# Patient Record
Sex: Male | Born: 1940 | Race: White | Hispanic: No | Smoking: Never smoker
Health system: Southern US, Community
[De-identification: ages and names within clinical notes are randomized; demographics above are authoritative.]

## PROBLEM LIST (undated history)

## (undated) DIAGNOSIS — I10 Essential (primary) hypertension: Secondary | ICD-10-CM

## (undated) DIAGNOSIS — E785 Hyperlipidemia, unspecified: Secondary | ICD-10-CM

## (undated) DIAGNOSIS — I509 Heart failure, unspecified: Secondary | ICD-10-CM

## (undated) DIAGNOSIS — D649 Anemia, unspecified: Secondary | ICD-10-CM

---

## 2014-02-02 HISTORY — PX: CORONARY ARTERY BYPASS GRAFT: SHX141

## 2014-02-11 ENCOUNTER — Emergency Department: Payer: No Typology Code available for payment source

## 2014-02-11 ENCOUNTER — Emergency Department
Admission: EM | Admit: 2014-02-11 | Discharge: 2014-02-11 | Disposition: A | Discharge status: Home / Self Care | Payer: No Typology Code available for payment source | Attending: Emergency Medicine | Admitting: Emergency Medicine

## 2014-02-11 ENCOUNTER — Emergency Department: Payer: PRIVATE HEALTH INSURANCE

## 2014-02-11 DIAGNOSIS — D649 Anemia, unspecified: Secondary | ICD-10-CM | POA: Insufficient documentation

## 2014-02-11 DIAGNOSIS — I509 Heart failure, unspecified: Secondary | ICD-10-CM | POA: Insufficient documentation

## 2014-02-11 DIAGNOSIS — Z951 Presence of aortocoronary bypass graft: Secondary | ICD-10-CM | POA: Insufficient documentation

## 2014-02-11 DIAGNOSIS — R0789 Other chest pain: Secondary | ICD-10-CM

## 2014-02-11 DIAGNOSIS — I10 Essential (primary) hypertension: Secondary | ICD-10-CM | POA: Insufficient documentation

## 2014-02-11 DIAGNOSIS — R071 Chest pain on breathing: Secondary | ICD-10-CM | POA: Insufficient documentation

## 2014-02-11 DIAGNOSIS — E785 Hyperlipidemia, unspecified: Secondary | ICD-10-CM | POA: Insufficient documentation

## 2014-02-11 HISTORY — DX: Heart failure, unspecified: I50.9

## 2014-02-11 HISTORY — DX: Anemia, unspecified: D64.9

## 2014-02-11 HISTORY — DX: Hyperlipidemia, unspecified: E78.5

## 2014-02-11 HISTORY — DX: Essential (primary) hypertension: I10

## 2014-02-11 LAB — CBC AND DIFFERENTIAL
Basophils Absolute Automated: 0.02 10*3/uL (ref 0.00–0.20)
Basophils Automated: 0 %
Eosinophils Absolute Automated: 0.24 10*3/uL (ref 0.00–0.70)
Eosinophils Automated: 3 %
Hematocrit: 26.1 % — ABNORMAL LOW (ref 42.0–52.0)
Hgb: 8.5 g/dL — ABNORMAL LOW (ref 13.0–17.0)
Lymphocytes Absolute Automated: 1.57 10*3/uL (ref 0.50–4.40)
Lymphocytes Automated: 18 %
MCH: 23.2 pg — ABNORMAL LOW (ref 28.0–32.0)
MCHC: 32.6 g/dL (ref 32.0–36.0)
MCV: 71.1 fL — ABNORMAL LOW (ref 80.0–100.0)
MPV: 9.6 fL (ref 9.4–12.3)
Monocytes Absolute Automated: 0.59 10*3/uL (ref 0.00–1.20)
Monocytes: 7 %
Neutrophils Absolute: 6.53 10*3/uL (ref 1.80–8.10)
Neutrophils: 73 %
Platelets: 405 10*3/uL — ABNORMAL HIGH (ref 140–400)
RBC: 3.67 10*6/uL — ABNORMAL LOW (ref 4.70–6.00)
RDW: 18 % — ABNORMAL HIGH (ref 12–15)
WBC: 8.95 10*3/uL (ref 3.50–10.80)

## 2014-02-11 LAB — COMPREHENSIVE METABOLIC PANEL
ALT: 29 U/L (ref 0–55)
AST (SGOT): 24 U/L (ref 5–34)
Albumin/Globulin Ratio: 1 (ref 0.9–2.2)
Albumin: 3.1 g/dL — ABNORMAL LOW (ref 3.5–5.0)
Alkaline Phosphatase: 72 U/L (ref 38–106)
Anion Gap: 12 (ref 5.0–15.0)
BUN: 22 mg/dL (ref 9.0–28.0)
Bilirubin, Total: 0.5 mg/dL (ref 0.2–1.2)
CO2: 22 mEq/L (ref 22–29)
Calcium: 8.8 mg/dL (ref 7.9–10.2)
Chloride: 107 mEq/L (ref 100–111)
Creatinine: 0.9 mg/dL (ref 0.7–1.3)
Globulin: 3.2 g/dL (ref 2.0–3.6)
Glucose: 114 mg/dL — ABNORMAL HIGH (ref 70–100)
Potassium: 4.4 mEq/L (ref 3.5–5.1)
Protein, Total: 6.3 g/dL (ref 6.0–8.3)
Sodium: 141 mEq/L (ref 136–145)

## 2014-02-11 LAB — TROPONIN I: Troponin I: 0.08 ng/mL (ref 0.00–0.09)

## 2014-02-11 LAB — GFR: EGFR: >60

## 2014-02-11 MED ORDER — OXYCODONE-ACETAMINOPHEN 5-325 MG PO TABS
1.0000 | ORAL_TABLET | Freq: Once | ORAL | Status: AC
Start: 2014-02-11 — End: 2014-02-11
  Administered 2014-02-11: 1 via ORAL
  Filled 2014-02-11: qty 1

## 2014-02-11 MED ORDER — OXYCODONE-ACETAMINOPHEN 5-325 MG PO TABS
ORAL_TABLET | ORAL | Status: DC
Start: 2014-02-11 — End: 2017-12-01

## 2014-02-11 NOTE — Discharge Instructions (Signed)
Musculoskeletal Chest Pain    You have been diagnosed with musculoskeletal chest pain.    Your pain is due to an injury or inflammation (swelling) of the muscles, ligaments, cartilage (soft bone), or bone in your chest. The pain is usually sharp and knife-like and becomes worse with twisting, bending, or moving. It commonly occurs in a small area, and can be irritated by pressing on it. There is usually no shortness of breath, lightheadedness, weakness, or sweaty feeling. Some children will have pain when taking a deep breath or when coughing. Exercise usually does not affect these symptoms.    Musculoskeletal chest pain is treated with anti-inflammatory medications like ibuprofen (Advil or Motrin) or naproxen (Aleve). Other pain medications are usually not needed. Depending on the reason for your symptoms, either warm or cool compresses (damp washcloths laid on the skin) may be helpful.    Most musculoskeletal chest pain improves over several days.    You do not need to follow up with a doctor unless your symptoms get worse or fail to improve in the next few days.    YOU SHOULD SEEK MEDICAL ATTENTION IMMEDIATELY, EITHER HERE OR AT THE NEAREST EMERGENCY DEPARTMENT, IF ANY OF THE FOLLOWING OCCURS:   Your pain gets worse.   Your pain makes you feel short of breath, nauseated, or sweaty.   You notice that your pain gets worse as you walk, go up stairs, or exert yourself.   You have any weakness or lightheadedness with your pain.   Your pain makes breathing difficult.   You develop a swollen leg.   Your symptoms get worse or you have other concerns.

## 2014-02-11 NOTE — ED Notes (Signed)
Status post CABG x 2 in [place] 1 week ago, [place] on 6/25; pt reports unrelieved chest pain at incision site. No new SOB.

## 2014-02-11 NOTE — ED Provider Notes (Signed)
EMERGENCY DEPARTMENT HISTORY AND PHYSICAL EXAM     Physician/Midlevel provider first contact with patient: 02/11/14 1733         Date: 02/11/2014  Patient Name: Matthew Hawkins    History of Presenting Illness     Chief Complaint   Patient presents with   . Chest Pain       History Provided By: Patient and family members     Chief Complaint: Chest Pain   Onset: June 18th 2015  Timing: constant   Location: at CABG incision site   Severity: moderate  Modifying Factors: worse w/ taking deep breaths and applying pressure to area; no relief w/ taking Tylenol    Associated Symptoms: none     Additional History: MICHAH MINTON is a 73 y.o. male with h/o HTN c/o constant chest pain s/p CABG operation on June 18th 2015. Pt had a CABG operate at [facility] because pt had a heart disease. Pt describes his CP as "dull" sensation and has the CP since the CABG operation. Pt's CP is exacerbated w/ taking deep breaths and applying pressure to area. Pt has been taking Tylenol for his CP with no relief. Family members sts that there was a miscommunication with getting pain medication Rx so pt only has been taking OTC Tylenol. Pt has an appointment with a local cardiologist, Dr. Suezanne Jacquet (phone#[phone]) this upcoming Thursday. Pt lives in [place] but pt is staying here because he has no family to care for his in [place]. Pt's brother lives here and is caring for him after the CABG procedure.       [facility] cardiac outreach operator phone #: [phone]    Pt denies any SOB, fever, chills, congestion, cough, nausea, vomiting,  or any other symptoms.     PCP: Pcp, Heriberto Antigua, MD (General)      No current facility-administered medications for this encounter.     Current Outpatient Prescriptions   Medication Sig Dispense Refill   . aspirin EC 81 MG EC tablet Take 81 mg by mouth daily.     . Cholecalciferol (VITAMIN D) 1000 UNIT tablet Take 1,000 Units by mouth daily.     Marland Kitchen docusate  sodium (COLACE) 100 MG capsule Take 100 mg by mouth 2 (two) times daily.     . ferrous sulfate 325 (65 FE) MG tablet Take 325 mg by mouth every morning with breakfast.     . folic acid (FOLVITE) 1 MG tablet Take 1 mg by mouth daily.     . furosemide (LASIX) 20 MG tablet Take 20 mg by mouth 2 (two) times daily.     Boris Lown Oil 1000 MG Cap Take by mouth.     . metoprolol (TOPROL-XL) 100 MG 24 hr tablet Take 100 mg by mouth daily.     . metoprolol XL (TOPROL-XL) 50 MG 24 hr tablet Take 50 mg by mouth daily.     . multivitamins-fortified A-D-K (SOURCECF) solution Take 0.5 mLs by mouth daily.     Marland Kitchen oxyCODONE-acetaminophen (PERCOCET) 5-325 MG per tablet 1-2 tablets by mouth every 4-6 hours as needed for pain;  Do not drive or operate machinery while taking this medicine 20 tablet 0       Past History     Past Medical History:  Past Medical History   Diagnosis Date   . Anemia    . Hypertension    . Congestive heart failure    . HLD (hyperlipidemia)  Past Surgical History:  Past Surgical History   Procedure Laterality Date   . Coronary artery bypass graft N/A 02/02/2014     Two vessel bypass.       Family History:  No family history on file.    Social History:  History   Substance Use Topics   . Smoking status: Never Smoker    . Smokeless tobacco: Never Used   . Alcohol Use: Yes       Allergies:  No Known Allergies    Review of Systems     Review of Systems   Constitutional: Negative for fever and chills.   HENT: Negative for congestion.    Eyes: Negative for discharge and redness.   Respiratory: Negative for cough and shortness of breath.    Cardiovascular: Positive for chest pain.   Gastrointestinal: Negative for nausea and vomiting.   Genitourinary: Negative for difficulty urinating.   Musculoskeletal: Negative for gait problem.   Skin: Negative for rash.   Allergic/Immunologic: Negative for environmental allergies and food allergies.          Physical Exam   BP 160/72 mmHg  Pulse 91  Temp(Src) 98.4 F (36.9 C)   Resp 18  Ht 1.6 m  Wt 74.39 kg  BMI 29.06 kg/m2  SpO2 99%    Physical Exam   Constitutional: He is oriented to person, place, and time and well-developed, well-nourished, and in no distress.   HENT:   Head: Normocephalic and atraumatic.   Eyes: Conjunctivae and EOM are normal.   Neck: Normal range of motion. Neck supple.   Cardiovascular: Normal rate, regular rhythm and normal heart sounds.    Pulmonary/Chest: Effort normal and breath sounds normal. He has no wheezes.   Well healing incision scar at the CABG site, no pus/discharge/erythema. Pt's chest pain is reproducible to light palpations.    Abdominal: Soft. There is no tenderness.   Musculoskeletal: Normal range of motion.   Neurological: He is alert and oriented to person, place, and time.   Skin: Skin is warm and dry.   Psychiatric: Affect and judgment normal.   Nursing note and vitals reviewed.        Diagnostic Study Results     Labs -     Results    Procedure Component Value Units Date/Time    Troponin I [[id number]] Collected:  02/11/14 1809    Specimen Information:  Blood Updated:  02/11/14 1843     Troponin I 0.08 ng/mL     Comprehensive metabolic panel [[id number]]  (Abnormal) Collected:  02/11/14 1809    Specimen Information:  Blood Updated:  02/11/14 1836     Glucose 114 (H) mg/dL      BUN 78.2 mg/dL      Creatinine 0.9 mg/dL      Sodium 956 mEq/L      Potassium 4.4 mEq/L      Chloride 107 mEq/L      CO2 22 mEq/L      CALCIUM 8.8 mg/dL      Protein, Total 6.3 g/dL      Albumin 3.1 (L) g/dL      AST (SGOT) 24 U/L      ALT 29 U/L      Alkaline Phosphatase 72 U/L      Bilirubin, Total 0.5 mg/dL      Globulin 3.2 g/dL      Albumin/Globulin Ratio 1.0      Anion Gap 12.0     GFR [[id number]] Collected:  02/11/14  1809     EGFR >60.0 Updated:  02/11/14 1836    CBC and differential [[id number]]  (Abnormal) Collected:  02/11/14 1809    Specimen Information:  Blood / Blood Updated:  02/11/14 1815     WBC 8.95 x10 3/uL      RBC 3.67 (L) x10 6/uL      Hgb 8.5  (L) g/dL      Hematocrit 47.8 (L) %      MCV 71.1 (L) fL      MCH 23.2 (L) pg      MCHC 32.6 g/dL      RDW 18 (H) %      Platelets 405 (H) x10 3/uL      MPV 9.6 fL      Neutrophils 73 %      Lymphocytes Automated 18 %      Monocytes 7 %      Eosinophils Automated 3 %      Basophils Automated 0 %      Immature Granulocyte Unmeasured %      Neutrophils Absolute 6.53 x10 3/uL      Abs Lymph Automated 1.57 x10 3/uL      Abs Mono Automated 0.59 x10 3/uL      Abs Eos Automated 0.24 x10 3/uL      Absolute Baso Automated 0.02 x10 3/uL      Absolute Immature Granulocyte Unmeasured x10 3/uL           Radiologic Studies -   Radiology Results (24 Hour)    Procedure Component Value Units Date/Time    XR Chest  AP Portable [[id number]] Collected:  02/11/14 1804    Order Status:  Completed Updated:  02/11/14 1808    Narrative:      HISTORY:  Chest pain.    TECHNIQUE: Single frontal view of the chest was obtained.     PRIORS: None.    FINDINGS: The lung fields are clear. There are no pleural effusions. The  cardiac silhouette and hila are normal. The trachea is midline. The bony  structures are essentially unremarkable.       Impression:       No active lung disease.    Georgana Curio, MD   02/11/2014 6:04 PM        .      Medical Decision Making   I am the first provider for this patient.    I reviewed the vital signs, available nursing notes, past medical history, past surgical history, family history and social history.    Vital Signs-Reviewed the patient's vital signs.     Patient Vitals for the past 12 hrs:   BP Temp Pulse Resp   02/11/14 1739 160/72 mmHg 98.4 F (36.9 C) 91 18   02/11/14 1738 160/72 mmHg 97.5 F (36.4 C) 80 20       Pulse Oximetry Analysis - Normal 99% on RA    Cardiac Monitor:  Rate: 74  Rhythm:  Normal Sinus Rhythm     EKG:  Interpreted by the EP.   Time Interpreted: 17:30    Rate: 79   Rhythm: Normal Sinus Rhythm    Interpretation: NST, no STEMI   Comparison: No prior study is available for  comparison.    Old Medical Records: Nursing notes.     ED Course:   6:34 PM - Updated pt and family members on pt's CXR report. Pt is feeling better, but still in pain. Awaiting additional lab results.  6:55 PM - Paging pt's local cardiologist, Dr. Suezanne Jacquet.     6:59 PM - d/w Dr. Kandra Nicolas, pt's local cardiologist who agrees w/ plan. He will see pt in the office as previously scheduled.     7:00 PM - Patient feels better and wants to go home. Patient is aware of all results. He is amenable for discharge w/ family members at bedside. Discussed d/c instructions including outpatient f/u with Dr. Kandra Nicolas as previously scheduled. Advised pt to take the prescribed Percocet for pain control.  Discussed return precautions. Pt verbalizes understanding and agrees with plan. All questions and concerns were addressed.     Provider Notes: Pt with reproducible chest wall pain that pt states has been constant since surgery with no pain meds other than OTC tylenol due to script issues.  Pt felt improved in ED with narcotic pain meds with neg workup.  D/w cardiology scheduled to see him this week and agreed to plan.      Diagnosis     Clinical Impression:   1. Chest wall pain    2. S/P CABG x 2        _______________________________    Attestations:  This note is prepared by Tammi Klippel, acting as scribe for Humberto Leep, MD    Humberto Leep, MD, The scribe's documentation has been prepared under my direction and personally reviewed by me in its entirety.  I confirm that the note above accurately reflects all work, treatment, procedures, and medical decision making performed by me.    _______________________________                            Humberto Leep, MD  02/11/14 2020

## 2014-02-12 LAB — ECG 12-LEAD
Atrial Rate: 79 {beats}/min
P Axis: 19 degrees
P-R Interval: 176 ms
Q-T Interval: 422 ms
QRS Duration: 88 ms
QTC Calculation (Bezet): 483 ms
R Axis: 24 degrees
T Axis: 99 degrees
Ventricular Rate: 79 {beats}/min

## 2014-03-05 ENCOUNTER — Inpatient Hospital Stay
Admission: RE | Admit: 2014-03-05 | Discharge: 2014-03-05 | Disposition: A | Discharge status: Home / Self Care | Payer: PRIVATE HEALTH INSURANCE | Source: Ambulatory Visit | Attending: Cardiovascular Disease | Admitting: Cardiovascular Disease

## 2014-03-05 VITALS — Ht 62.99 in

## 2014-03-05 DIAGNOSIS — Z951 Presence of aortocoronary bypass graft: Secondary | ICD-10-CM | POA: Insufficient documentation

## 2014-03-05 DIAGNOSIS — Z954 Presence of other heart-valve replacement: Secondary | ICD-10-CM | POA: Insufficient documentation

## 2014-03-05 NOTE — Progress Notes (Signed)
Physical Assessment    BP  R:  140/76  L:  134/74    Pulse:  regular, 59  O2 sat: 98%    Pulses  Left Side Right Side   PT:  + Doppler  PT:  + Doppler   Radial:  1+   Radial:  1+   Carotid:  1+ Carotid:  1+       Heart Sounds:  S1, S2 and systolic murmur     Lung Sounds:  clear right upper, middle and lower and clear left upper, middle and lower     Muscle Skeletal:  no deficit    Incisions:  Sternum: Steri Strips remain in place with encrustation underneath (> 1 month post-op). No visible active drainage. No redness/induration. Instructed re: should shower/wash area to encourage strips to fall off.           Left leg vein harvest site: Steri Strip in place, no drainage/redness/induration.    Peripheral Edema:  the right leg trace and the left leg trace; brisk capillary refill    Diabetic Foot Check:  n/a

## 2014-03-05 NOTE — Progress Notes (Signed)
   Patient came in for initial assessment for Cardiac Rehab.     Had CABG x2 and AVR done on 01/31/14.  Patient stated he had been expecting to have an AVR at some point but when he noted chest pain while driving he called his cardiologist who urged him to go to the ED then admitted, where he had both procedures.     Originally from [place], but staying with brother in [place] until he is cleared by Careers adviser in [place] to resume living independently.  At that point he will return to [place] to complete rehab.     Reports sternal pain (4/10) that is relieved by pain medications.     Prostate cancer in 2013

## 2014-03-07 ENCOUNTER — Inpatient Hospital Stay
Admission: RE | Admit: 2014-03-07 | Discharge: 2014-03-07 | Disposition: A | Discharge status: Home / Self Care | Payer: PRIVATE HEALTH INSURANCE | Source: Ambulatory Visit

## 2014-03-07 VITALS — BP 116/66 | Wt 169.8 lb

## 2014-03-07 DIAGNOSIS — Z954 Presence of other heart-valve replacement: Secondary | ICD-10-CM

## 2014-03-07 DIAGNOSIS — Z951 Presence of aortocoronary bypass graft: Secondary | ICD-10-CM

## 2014-03-07 NOTE — Progress Notes (Signed)
Pt oriented to phase II cardiac rehab per protocol.  Medication list reviewed.  Pt reports intermittent chest wall discomfort.  Rates pain 4/10 pre-exercise today.  Reviewed the "Readiness for Exercise Checklist", "Boarding Pass", and RPE scale.  Introduced radial pulse check; accurate count at rest.  Pt oriented to exercise equipment.  Exercised without decompensation.  No c/o chest wall discomfort.   Exercise prescription to be advanced as tolerated.

## 2014-03-11 ENCOUNTER — Inpatient Hospital Stay
Admission: RE | Admit: 2014-03-11 | Discharge: 2014-03-11 | Disposition: A | Discharge status: Home / Self Care | Payer: PRIVATE HEALTH INSURANCE | Source: Ambulatory Visit

## 2014-03-11 VITALS — Wt 169.5 lb

## 2014-03-11 DIAGNOSIS — Z954 Presence of other heart-valve replacement: Secondary | ICD-10-CM

## 2014-03-11 DIAGNOSIS — Z951 Presence of aortocoronary bypass graft: Secondary | ICD-10-CM

## 2014-03-11 NOTE — Progress Notes (Signed)
Pt presented for day 2 phase II cardiac rehab.  Pt reports having a stress today "passed".  Exercise deferred.  Pt to return to exercise on 03/13/14.

## 2014-03-13 ENCOUNTER — Inpatient Hospital Stay
Admission: RE | Admit: 2014-03-13 | Discharge: 2014-03-13 | Disposition: A | Discharge status: Home / Self Care | Payer: PRIVATE HEALTH INSURANCE | Source: Ambulatory Visit

## 2014-03-13 ENCOUNTER — Ambulatory Visit: Payer: PRIVATE HEALTH INSURANCE

## 2014-03-13 VITALS — BP 130/58 | Wt 169.7 lb

## 2014-03-13 DIAGNOSIS — Z954 Presence of other heart-valve replacement: Secondary | ICD-10-CM

## 2014-03-13 DIAGNOSIS — Z951 Presence of aortocoronary bypass graft: Secondary | ICD-10-CM

## 2014-03-14 ENCOUNTER — Ambulatory Visit: Payer: PRIVATE HEALTH INSURANCE

## 2014-03-14 ENCOUNTER — Inpatient Hospital Stay
Admission: RE | Admit: 2014-03-14 | Discharge: 2014-03-14 | Disposition: A | Discharge status: Home / Self Care | Payer: PRIVATE HEALTH INSURANCE | Source: Ambulatory Visit

## 2014-03-14 VITALS — BP 98/60 | Wt 169.9 lb

## 2014-03-14 DIAGNOSIS — Z954 Presence of other heart-valve replacement: Secondary | ICD-10-CM

## 2014-03-14 DIAGNOSIS — Z951 Presence of aortocoronary bypass graft: Secondary | ICD-10-CM

## 2014-03-14 NOTE — Addendum Note (Signed)
Encounter addended by: Thurnell Lose, RN on: 03/14/2014  6:07 PM<BR>     Documentation filed: Inpatient Document Flowsheet

## 2014-03-14 NOTE — Addendum Note (Signed)
Encounter addended by: Thurnell Lose, RN on: 03/14/2014  6:10 PM<BR>     Documentation filed: Inpatient Document Flowsheet

## 2014-03-18 ENCOUNTER — Inpatient Hospital Stay
Admission: RE | Admit: 2014-03-18 | Discharge: 2014-03-18 | Disposition: A | Discharge status: Home / Self Care | Payer: PRIVATE HEALTH INSURANCE | Source: Ambulatory Visit | Attending: Cardiovascular Disease | Admitting: Cardiovascular Disease

## 2014-03-18 ENCOUNTER — Ambulatory Visit: Payer: PRIVATE HEALTH INSURANCE

## 2014-03-18 VITALS — BP 104/58 | Wt 167.2 lb

## 2014-03-18 DIAGNOSIS — Z954 Presence of other heart-valve replacement: Secondary | ICD-10-CM

## 2014-03-18 DIAGNOSIS — Z951 Presence of aortocoronary bypass graft: Secondary | ICD-10-CM

## 2014-03-20 ENCOUNTER — Ambulatory Visit: Payer: PRIVATE HEALTH INSURANCE

## 2014-03-20 ENCOUNTER — Inpatient Hospital Stay
Admission: RE | Admit: 2014-03-20 | Discharge: 2014-03-20 | Disposition: A | Discharge status: Home / Self Care | Payer: PRIVATE HEALTH INSURANCE | Source: Ambulatory Visit

## 2014-03-20 VITALS — BP 104/52 | Wt 168.4 lb

## 2014-03-20 DIAGNOSIS — Z951 Presence of aortocoronary bypass graft: Secondary | ICD-10-CM

## 2014-03-20 DIAGNOSIS — Z954 Presence of other heart-valve replacement: Secondary | ICD-10-CM

## 2014-03-20 NOTE — Addendum Note (Signed)
Encounter addended by: Thurnell Lose, RN on: 03/20/2014  4:13 PM<BR>     Documentation filed: Inpatient Document Flowsheet

## 2014-03-21 ENCOUNTER — Ambulatory Visit: Payer: PRIVATE HEALTH INSURANCE

## 2014-03-21 ENCOUNTER — Inpatient Hospital Stay
Admission: RE | Admit: 2014-03-21 | Discharge: 2014-03-21 | Disposition: A | Discharge status: Home / Self Care | Payer: PRIVATE HEALTH INSURANCE | Source: Ambulatory Visit

## 2014-03-21 VITALS — BP 112/64 | Wt 167.4 lb

## 2014-03-21 DIAGNOSIS — Z951 Presence of aortocoronary bypass graft: Secondary | ICD-10-CM

## 2014-03-21 DIAGNOSIS — Z954 Presence of other heart-valve replacement: Secondary | ICD-10-CM

## 2014-03-25 ENCOUNTER — Ambulatory Visit: Payer: PRIVATE HEALTH INSURANCE

## 2014-03-25 ENCOUNTER — Inpatient Hospital Stay
Admission: RE | Admit: 2014-03-25 | Discharge: 2014-03-25 | Disposition: A | Discharge status: Home / Self Care | Payer: PRIVATE HEALTH INSURANCE | Source: Ambulatory Visit

## 2014-03-25 VITALS — BP 120/76 | Wt 169.9 lb

## 2014-03-25 DIAGNOSIS — Z951 Presence of aortocoronary bypass graft: Secondary | ICD-10-CM

## 2014-03-25 DIAGNOSIS — Z954 Presence of other heart-valve replacement: Secondary | ICD-10-CM

## 2014-03-25 NOTE — Progress Notes (Signed)
Completes 7 sessions phase 2 CR.  Patient is moving to [facility] at this time and will be discontinuing the program.  Remaining handouts provided to patient on exercise, diet, weather guidelines, stress and hypertension.  Advised patient go to medical records to sign a release so he can have his records transferred.  Has exercised without signs or symptoms and a normal hemodynamic response to exercise noted.  Cardiac monitor showing NSR with occasional PVC's.  Patient will be discharged from the program.

## 2014-03-26 NOTE — Progress Notes (Signed)
I have reviewed [facility] Cardiac Rehab data and recommendations.  I agree with plan.    Breklyn Fabrizio MD FACC  Medical Director, [facility] Cardiac Rehabilitation

## 2014-03-26 NOTE — Progress Notes (Signed)
I have reviewed [facility] Cardiac Rehab data and recommendations.  I agree with plan.    Orien Mayhall MD FACC  Medical Director, [facility] Cardiac Rehabilitation

## 2014-03-26 NOTE — Progress Notes (Signed)
I have reviewed [facility] Cardiac Rehab data and recommendations.  I agree with plan.    Takoda Janowiak MD FACC  Medical Director, [facility] Cardiac Rehabilitation

## 2014-03-27 ENCOUNTER — Ambulatory Visit: Payer: PRIVATE HEALTH INSURANCE

## 2014-03-28 ENCOUNTER — Ambulatory Visit: Payer: PRIVATE HEALTH INSURANCE

## 2014-04-01 ENCOUNTER — Ambulatory Visit: Payer: PRIVATE HEALTH INSURANCE

## 2014-04-03 ENCOUNTER — Ambulatory Visit: Payer: PRIVATE HEALTH INSURANCE

## 2014-04-04 ENCOUNTER — Ambulatory Visit: Payer: PRIVATE HEALTH INSURANCE

## 2014-04-08 ENCOUNTER — Ambulatory Visit: Payer: PRIVATE HEALTH INSURANCE

## 2014-04-10 ENCOUNTER — Ambulatory Visit: Payer: PRIVATE HEALTH INSURANCE

## 2014-04-11 ENCOUNTER — Ambulatory Visit: Payer: PRIVATE HEALTH INSURANCE

## 2014-04-15 ENCOUNTER — Ambulatory Visit: Payer: PRIVATE HEALTH INSURANCE

## 2014-04-29 NOTE — Addendum Note (Signed)
Encounter addended by: Thurnell Lose, RN on: 04/29/2014  8:00 AM<BR>     Documentation filed: Inpatient Patient Education

## 2020-09-15 IMAGING — MR MRI BRAIN WITH  IAC W/WO CONTRAST
12 series · 48 of 48 positions shown · IV contrast (gadavist)
Comparison: None

MRI BRAIN WITH  IAC W/WO CONTRAST,09/15/2020 [DATE]: 
CLINICAL INDICATION: Bilateral hearing loss, history prostate cancer
TECHNIQUE: Axial T1, Axial T2, Axial FLAIR, Diffusion weighted images, Sagittal 
T1, Enhanced Axial T1, and Enhanced coronal fat-suppressed T1 were obtained. 
Thin section axial T2 images through the IACs with sagittal and coronal 
reconstruction, thin section enhanced axial T1 images through the IACs with 
coronal reconstruction, enhanced coronal 2 mm images and enhanced axial images 
of the entire brain. FLAIR images of the brain. 8 cc gadavist is administered 
intravenously. The patient's eGFR was calculated to be 86 using the i-STAT 
device.

[Series 103: patient aligned mpr · axial · 15.6mm · 0.98mm/px · z∈[-76,+140]mm · 4 of 47 slices shown]
[im 1/47]
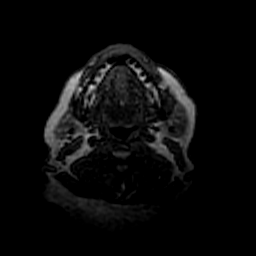
[im 16/47]
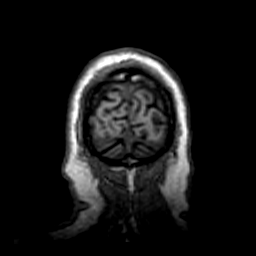
[im 31/47]
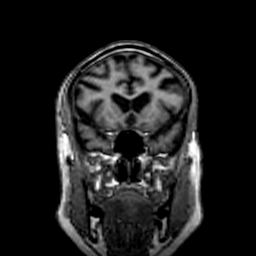
[im 47/47]
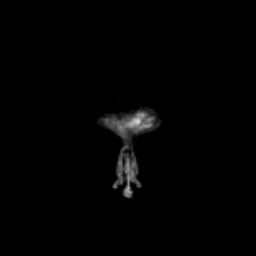

[Series 401: T2 · axial · 5.0mm · 0.41mm/px · z∈[-53,+102]mm · 2 of 27 slices shown]
[im 1/27]
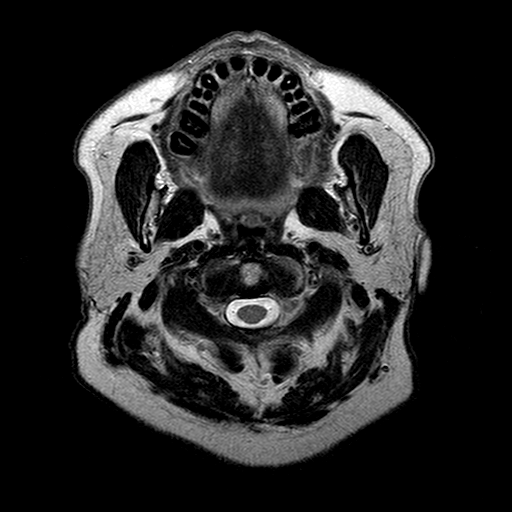
[im 27/27]
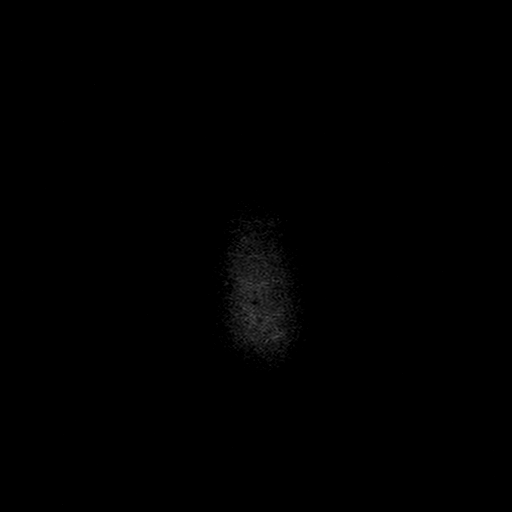

[Series 501: FLAIR · axial · 5.0mm · 0.73mm/px · z∈[-53,+102]mm · 3 of 27 slices shown]
[im 1/27]
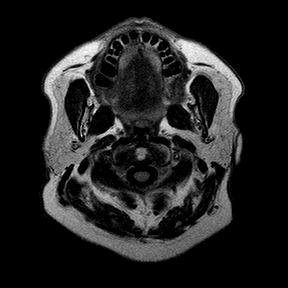
[im 14/27]
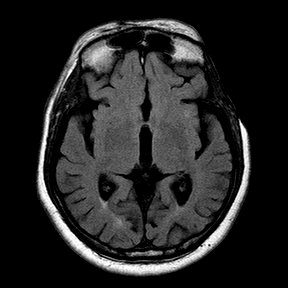
[im 27/27]
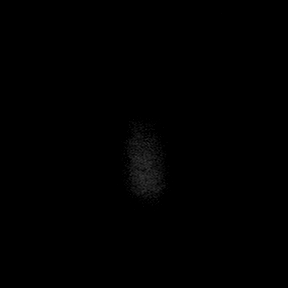

[Series 603: (id) · axial · 5.0mm · 1.80mm/px · z∈[-51,+103]mm · 3 of 27 slices shown]
[im 1/27]
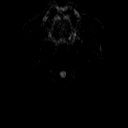
[im 14/27]
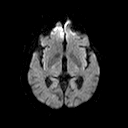
[im 27/27]
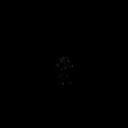

[Series 604: dadc map · axial · 5.0mm · 1.80mm/px · z∈[-51,+103]mm · 3 of 27 slices shown]
[im 1/27]
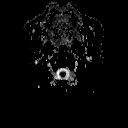
[im 14/27]
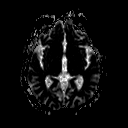
[im 27/27]
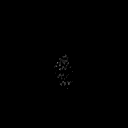

[Series 701: SWI · axial · 5.0mm · 0.85mm/px · z∈[-50,+104]mm · 3 of 27 slices shown]
[im 1/27]
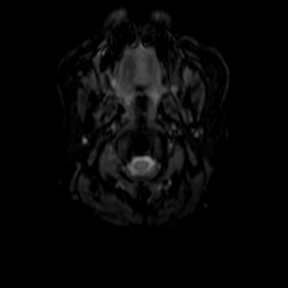
[im 14/27]
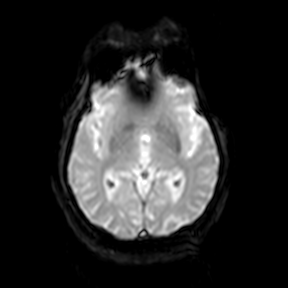
[im 27/27]
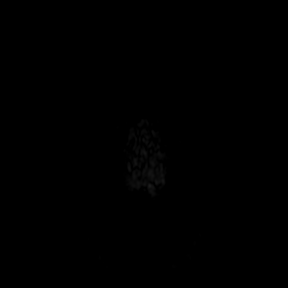

[Series 801: t2w_3d_drive · axial · 1.4mm · 0.51mm/px · z∈[-39,-5]mm · 5 of 50 slices shown]
[im 1/50]
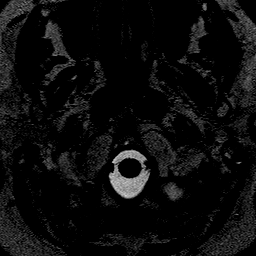
[im 13/50]
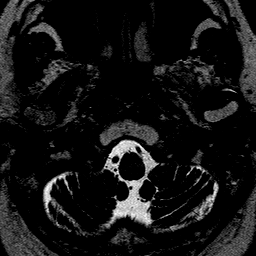
[im 25/50]
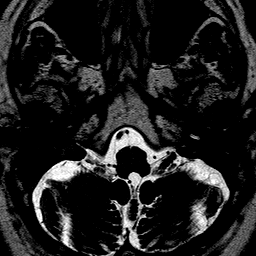
[im 37/50]
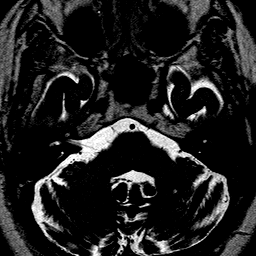
[im 50/50]
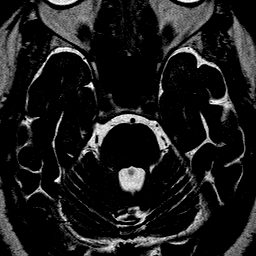

[Series 803: mpr/sag · sagittal · 0.5mm · 0.51mm/px · 8 of 83 slices shown]
[im 1/83]
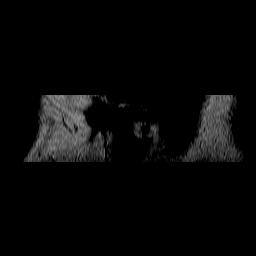
[im 12/83]
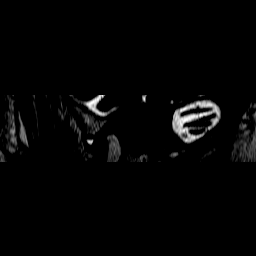
[im 24/83]
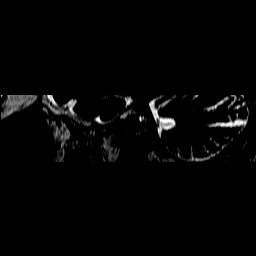
[im 36/83]
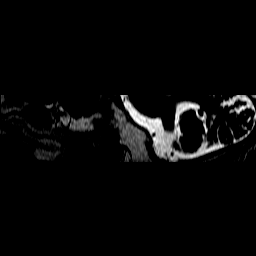
[im 47/83]
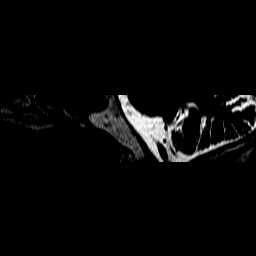
[im 59/83]
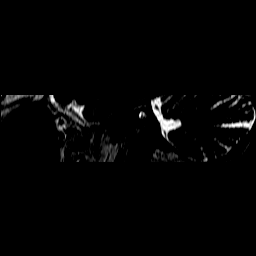
[im 71/83]
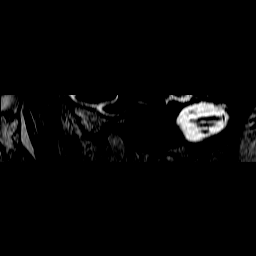
[im 83/83]
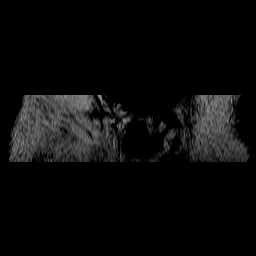

[Series 805: mpr/cor · coronal · 0.5mm · 0.51mm/px · 8 of 78 slices shown]
[im 1/78]
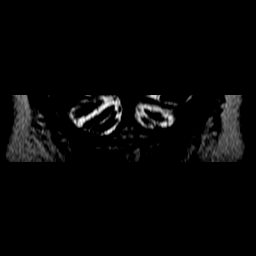
[im 12/78]
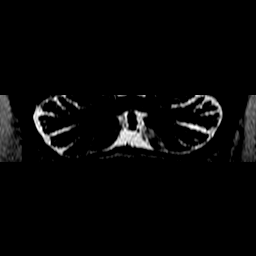
[im 23/78]
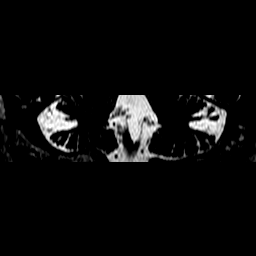
[im 34/78]
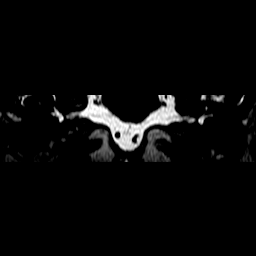
[im 45/78]
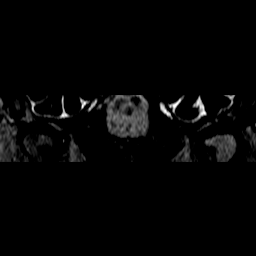
[im 56/78]
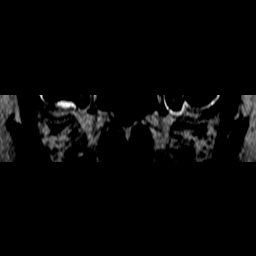
[im 67/78]
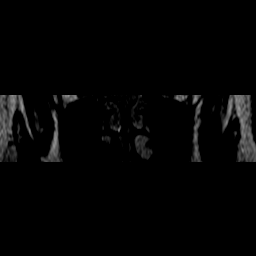
[im 78/78]
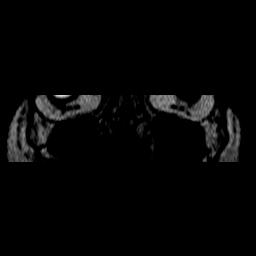

[Series 901: T1 post-contrast · axial · 5.0mm · 0.60mm/px · z∈[-53,+102]mm · 3 of 27 slices shown (1 of 3)]
[im 1/27]
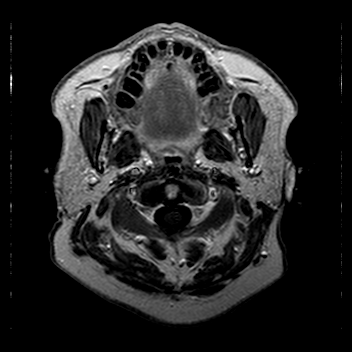
[im 14/27]
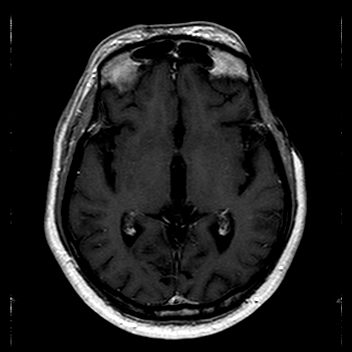
[im 27/27]
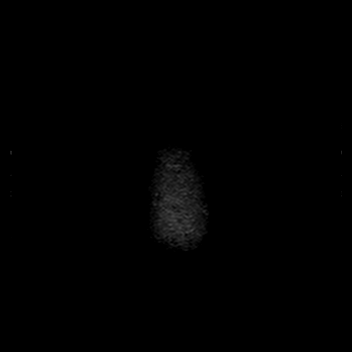

[Series 1101: T1 post-contrast · coronal · 4.0mm · 0.88mm/px · 4 of 36 slices shown (2 of 3)]
[im 1/36]
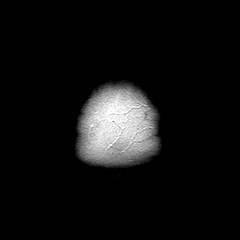
[im 12/36]
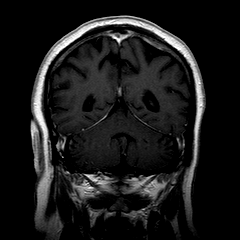
[im 24/36]
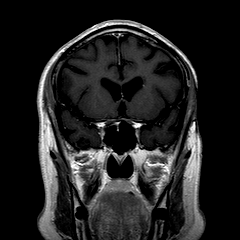
[im 36/36]
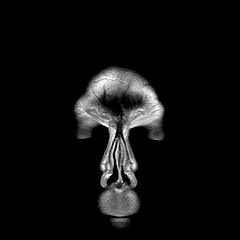

[Series 1201: T1 post-contrast · sagittal · 4.0mm · 0.86mm/px · 2 of 25 slices shown (3 of 3)]
[im 1/25]
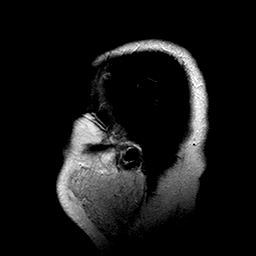
[im 25/25]
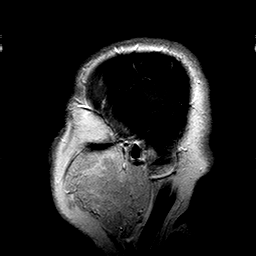

[48 of 48 positions shown; findings below may reference images not displayed]

FINDINGS: There is no evidence for vestibular schwannoma. Cranial nerves VII and 
VIII are unremarkable. No findings to indicate otic dysplasia. There is mild to 
moderate cerebral cortical atrophy. There is a small chronic left cerebellar 
infarct. There are mild to moderate chronic microangiopathic changes. Diffusion 
images are unremarkable. There is no hydrocephalus or extra-axial collection. No 
pathologic parenchymal or meningeal enhancement identified. There is no evidence 
for intracranial mass. No focal brainstem lesion identified. Major intracranial 
arterial segments are open. Sellar contents are normal. Craniocervical junction 
is open. Paranasal sinuses and otomastoid spaces are predominantly mild fluid in 
left mastoid tip is most likely chronic. There is no evidence for calvarial, 
skull base or orbital mass.
IMPRESSION: No evidence for vestibular schwannoma or other intracranial mass. 
Mild to moderate atrophy and chronic-appearing white matter microangiopathy. 
Small chronic left cerebellar infarct. 
Major intracranial arterial segments are open. 
Cervical spondylosis at C4-5. There appears to be mild cord effacement. This is 
not completely evaluated; cervical MRI would be useful if indicated. .

## 2022-03-30 IMAGING — MR MRI BRAIN WITH  IAC W/WO CONTRAST
2 of 16 series · 5 of 48 positions shown · IV contrast (Gadolinium)
Comparison: MRI from 09/15/2020.

________________________________________________________________________________________________ 
MRI BRAIN WITH  IAC W/WO CONTRAST,03/30/2022 [DATE]: 
CLINICAL INDICATION: Off balance.  Left hearing loss.
TECHNIQUE: MRI performed without and with contrast using IAC protocol.  7.5 mL 
of Gadavist were injected intravenously by hand. 0 mL of Gadavist were 
discarded. Patient was scanned on a 1.5T magnet.

[Series 802: T1 post-contrast · coronal · 1.0mm · 0.24mm/px · 3 of 180 slices shown (1 of 2)]
[im 30/180]
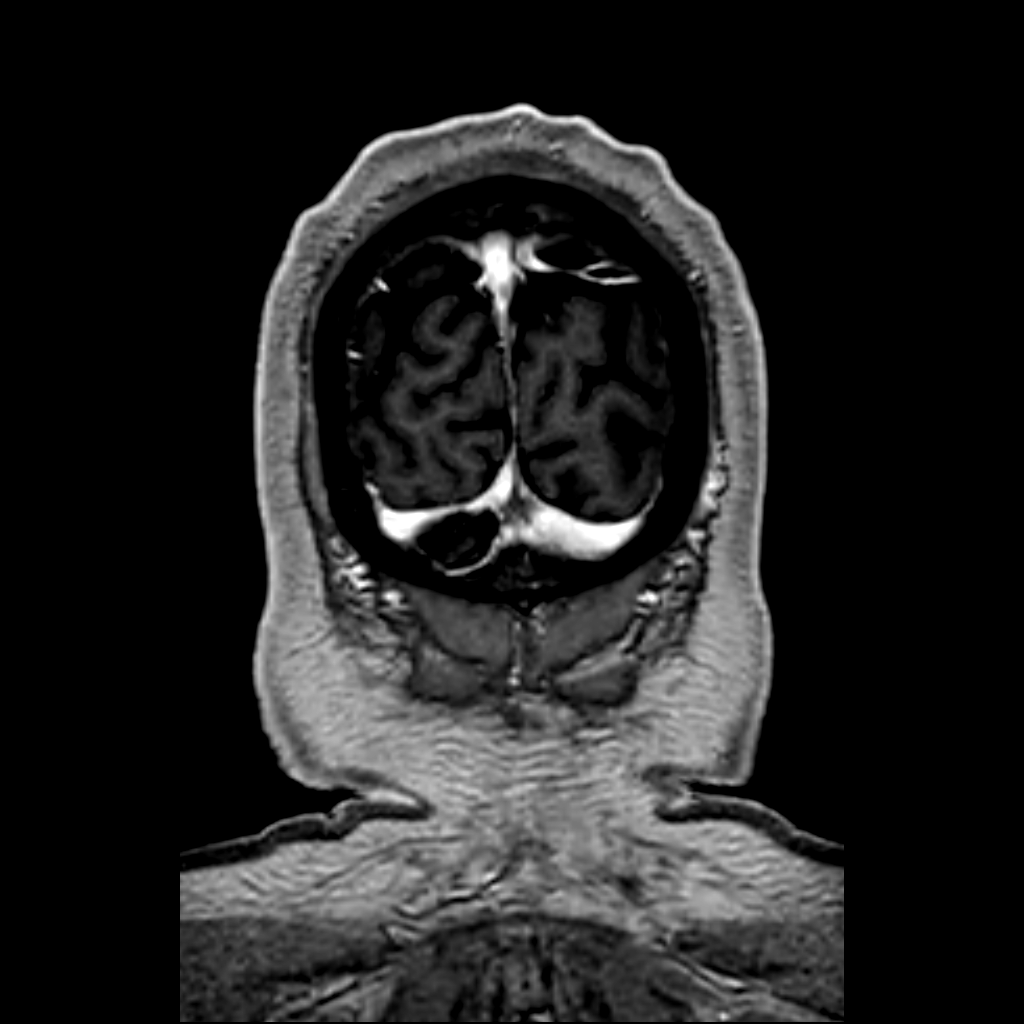
[im 90/180]
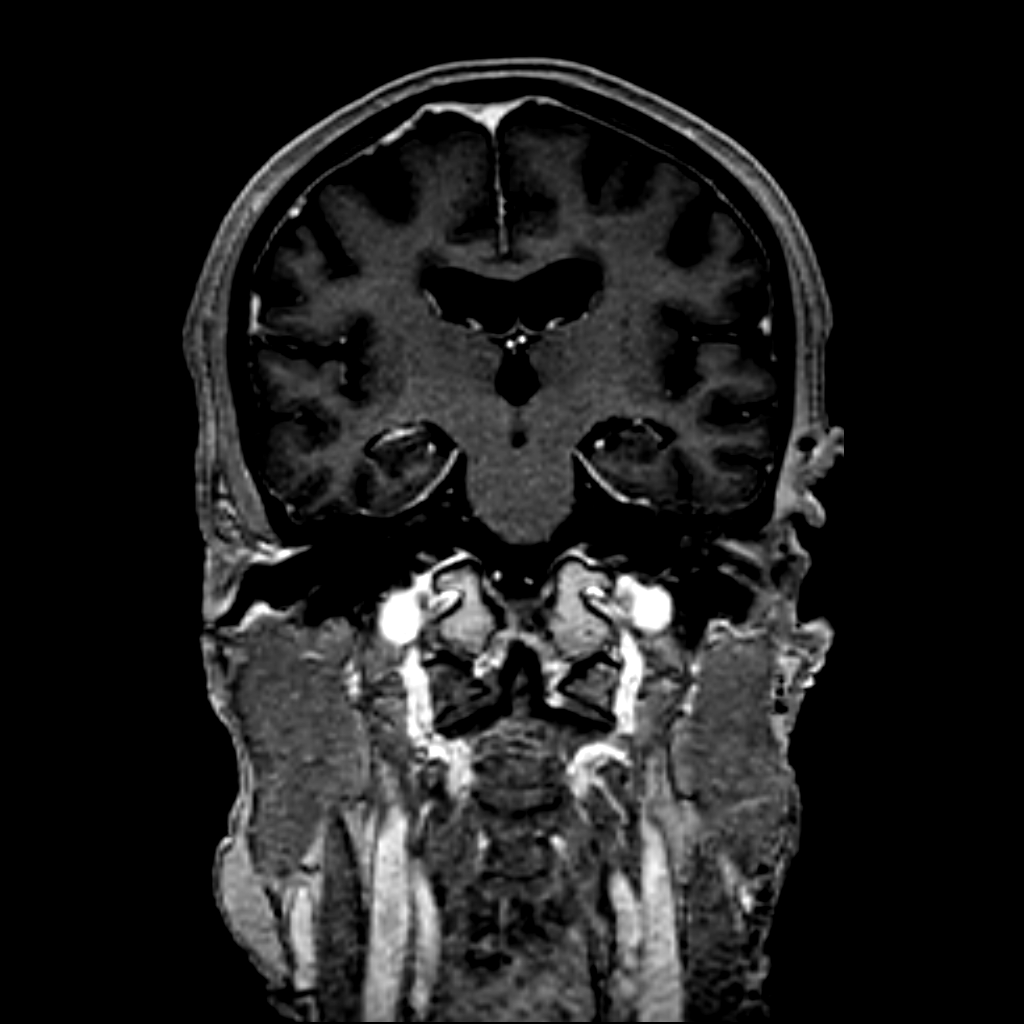
[im 150/180]
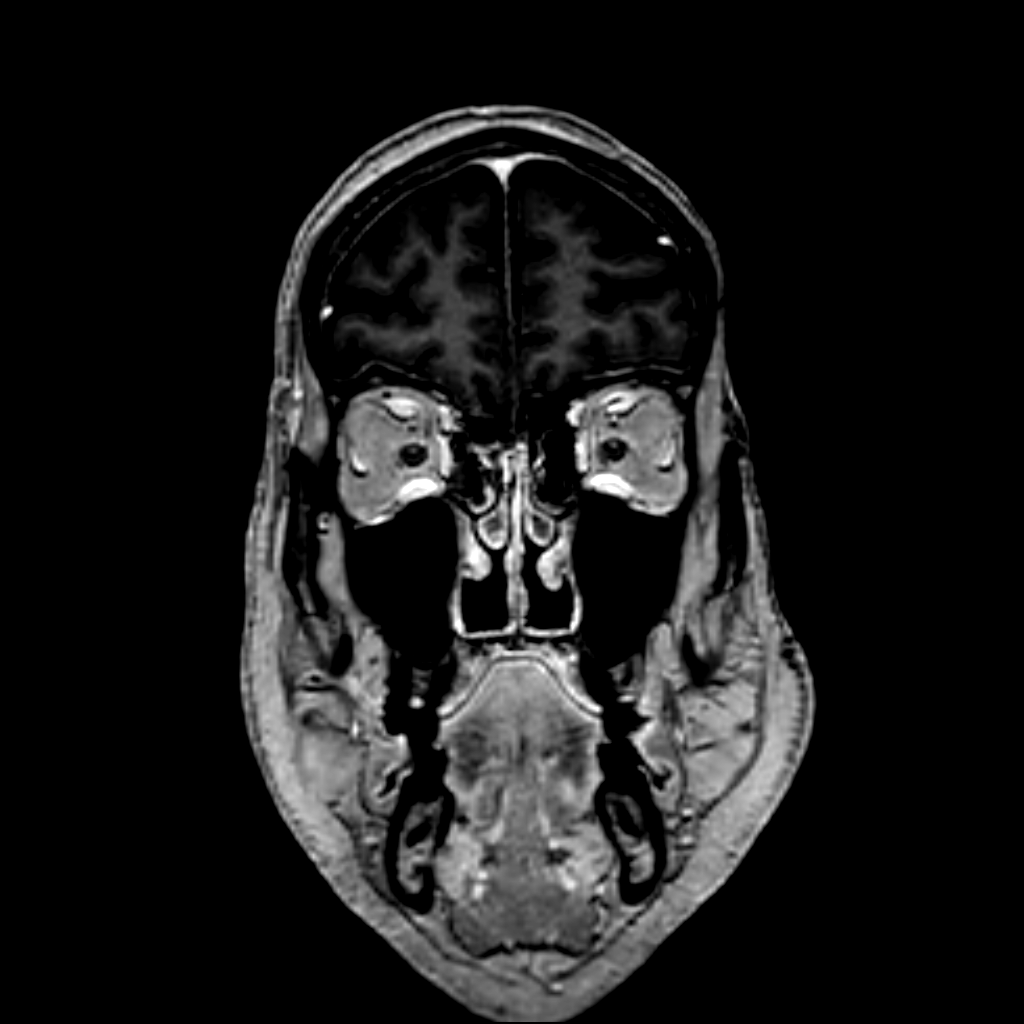

[Series 803: T1 post-contrast · axial · 1.0mm · 0.24mm/px · z∈[-81,-25]mm · 2 of 170 slices shown (2 of 2)]
[im 29/170]
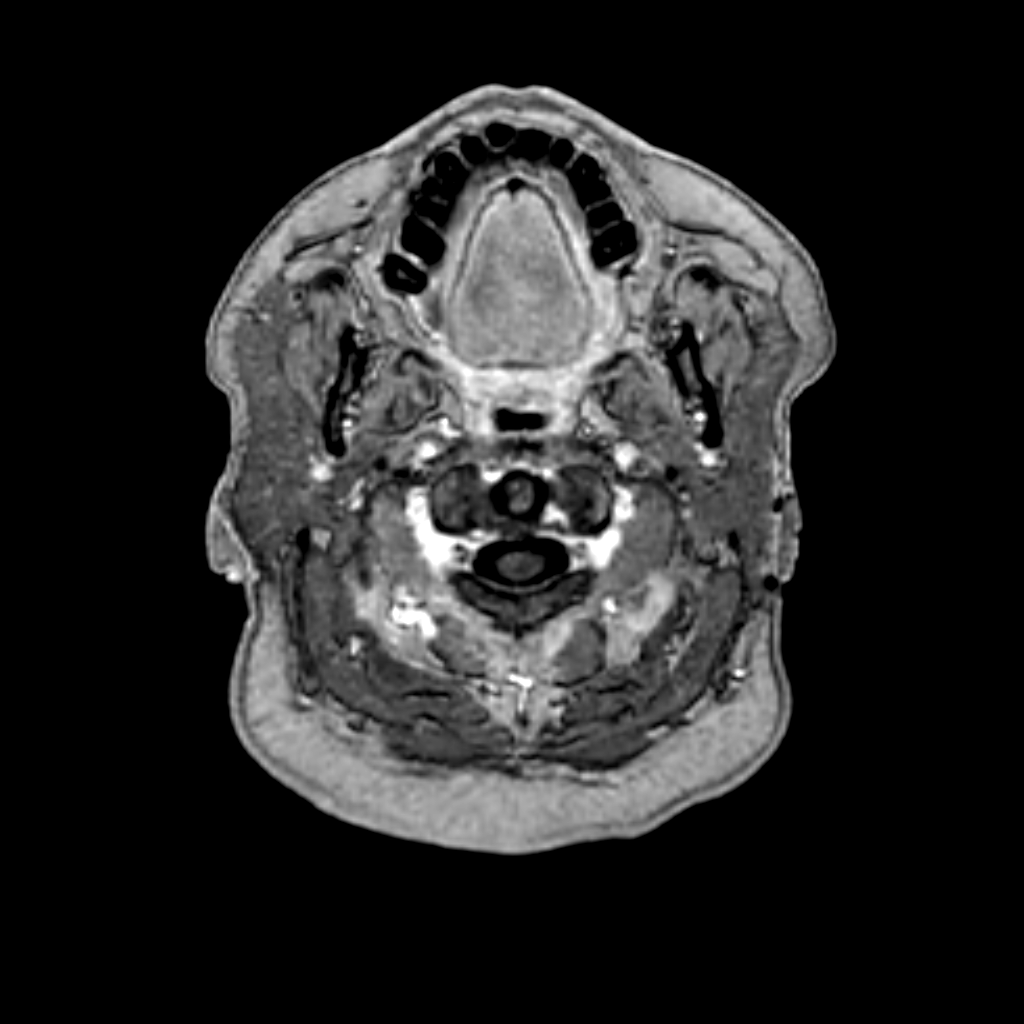
[im 85/170]
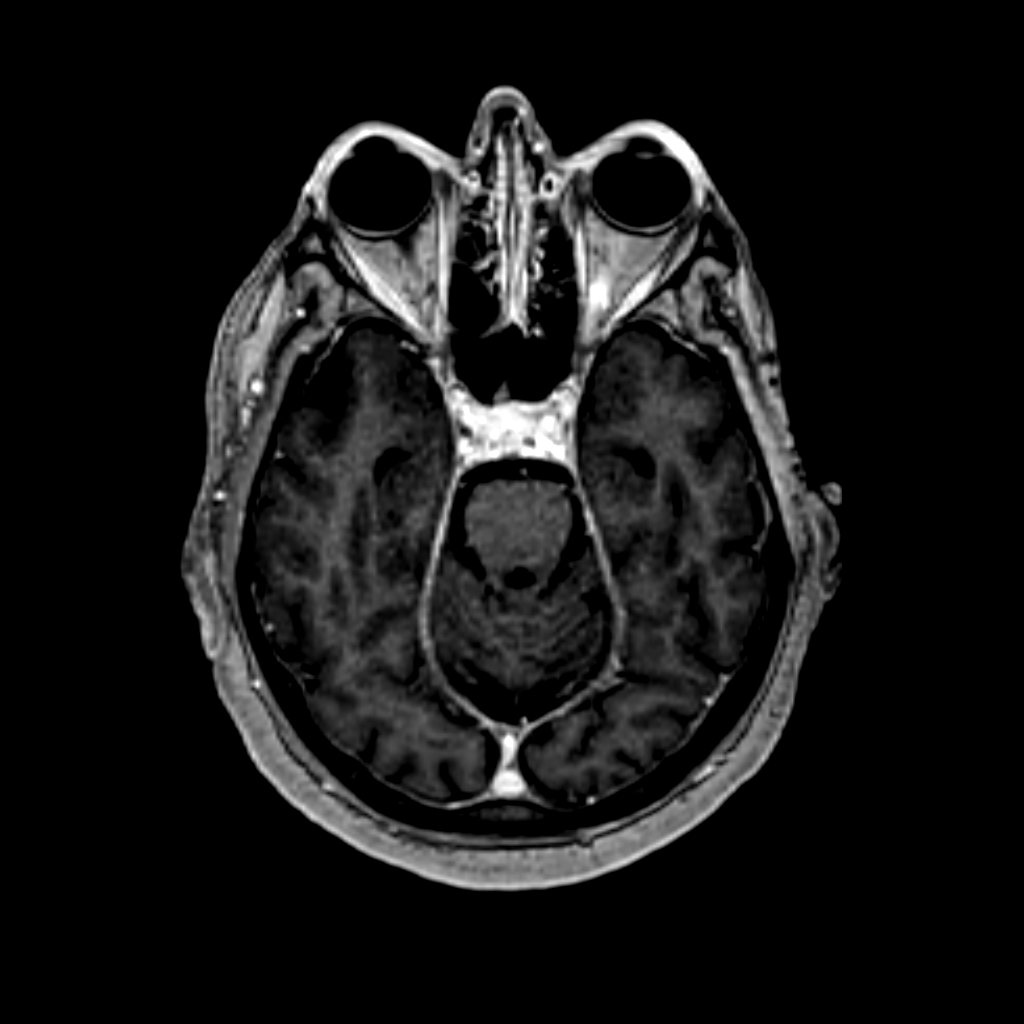

[5 of 48 positions shown; findings below may reference images not displayed]

FINDINGS: -------------------------------------------------------------------------------- 
------ 
INTERNAL AUDITORY CANALS:  
Fluid opacification of the left middle ear cavity and left mastoid air cells. No 
mass or pathologic enhancement along either internal auditory canal. 
-------------------------------------------------------------------------------- 
------ 
INTRACRANIAL:  
Chronic left cerebellar linear infarcts, stable. No acute ischemia. 
Periventricular and deep white matter change, probably secondary to 
microangiopathy. Foci of susceptibility artifact in the cerebrum and cerebellum 
from chronic hypertensive microhemorrhage.  Patency of the intracranial vascular 
flow voids.  No acute intracranial hemorrhage, mass effect, midline shift.  No 
hydrocephalus.  No pathologic contrast enhancement. 
-------------------------------------------------------------------------------- 
------ 
OTHER: 
ORBITS/SINUSES:  Visualized orbits show no acute abnormality or mass.  Status 
post right cataract extraction.  Visualized paranasal sinuses are clear. 
MARROW SIGNAL/SOFT TISSUES: No focal suspect signal abnormality. Left temporal 
scalp surgical change. 
-------------------------------------------------------------------------------- 
------
IMPRESSION: 1.  Fluid in the left middle ear cavity and left mastoid air cells, please 
correlate for otomastoiditis. 
2.  Chronic left cerebellar infarct. 
3.  White matter microangiopathic change.
